# Patient Record
Sex: Male | Born: 1975 | Race: White | Hispanic: No | Marital: Married | Smoking: Former smoker
Health system: Southern US, Community
[De-identification: ages and names within clinical notes are randomized; demographics above are authoritative.]

## PROBLEM LIST (undated history)

## (undated) DIAGNOSIS — I10 Essential (primary) hypertension: Secondary | ICD-10-CM

## (undated) DIAGNOSIS — N200 Calculus of kidney: Secondary | ICD-10-CM

---

## 2014-08-05 ENCOUNTER — Emergency Department: Payer: Self-pay | Admitting: Emergency Medicine

## 2014-08-05 LAB — URINALYSIS, COMPLETE
BILIRUBIN, UR: NEGATIVE
Bacteria: NONE SEEN
GLUCOSE, UR: NEGATIVE mg/dL (ref 0–75)
Hyaline Cast: 4
LEUKOCYTE ESTERASE: NEGATIVE
Nitrite: NEGATIVE
PH: 5 (ref 4.5–8.0)
Protein: 100
SPECIFIC GRAVITY: 1.015 (ref 1.003–1.030)
SQUAMOUS EPITHELIAL: NONE SEEN

## 2014-08-05 LAB — COMPREHENSIVE METABOLIC PANEL
ALT: 66 U/L — AB
Albumin: 4.3 g/dL (ref 3.4–5.0)
Alkaline Phosphatase: 104 U/L
Anion Gap: 20 — ABNORMAL HIGH (ref 7–16)
BUN: 14 mg/dL (ref 7–18)
Bilirubin,Total: 1.2 mg/dL — ABNORMAL HIGH (ref 0.2–1.0)
CALCIUM: 9.7 mg/dL (ref 8.5–10.1)
Chloride: 105 mmol/L (ref 98–107)
Co2: 13 mmol/L — ABNORMAL LOW (ref 21–32)
Creatinine: 1.49 mg/dL — ABNORMAL HIGH (ref 0.60–1.30)
EGFR (Non-African Amer.): 58 — ABNORMAL LOW
GLUCOSE: 136 mg/dL — AB (ref 65–99)
Osmolality: 278 (ref 275–301)
Potassium: 3.2 mmol/L — ABNORMAL LOW (ref 3.5–5.1)
SGOT(AST): 40 U/L — ABNORMAL HIGH (ref 15–37)
SODIUM: 138 mmol/L (ref 136–145)
TOTAL PROTEIN: 8 g/dL (ref 6.4–8.2)

## 2014-08-05 LAB — CBC
HCT: 54.4 % — AB (ref 40.0–52.0)
HGB: 18.3 g/dL — ABNORMAL HIGH (ref 13.0–18.0)
MCH: 29.3 pg (ref 26.0–34.0)
MCHC: 33.7 g/dL (ref 32.0–36.0)
MCV: 87 fL (ref 80–100)
Platelet: 242 10*3/uL (ref 150–440)
RBC: 6.23 10*6/uL — ABNORMAL HIGH (ref 4.40–5.90)
RDW: 13.5 % (ref 11.5–14.5)
WBC: 13.3 10*3/uL — AB (ref 3.8–10.6)

## 2014-08-05 LAB — LIPASE, BLOOD: Lipase: 165 U/L (ref 73–393)

## 2015-05-26 ENCOUNTER — Emergency Department
Admission: EM | Admit: 2015-05-26 | Discharge: 2015-05-26 | Disposition: A | Discharge status: Home / Self Care | Payer: BLUE CROSS/BLUE SHIELD | Attending: Emergency Medicine | Admitting: Emergency Medicine

## 2015-05-26 ENCOUNTER — Encounter: Payer: Self-pay | Admitting: General Practice

## 2015-05-26 ENCOUNTER — Other Ambulatory Visit: Payer: Self-pay

## 2015-05-26 DIAGNOSIS — I10 Essential (primary) hypertension: Secondary | ICD-10-CM | POA: Insufficient documentation

## 2015-05-26 DIAGNOSIS — Z87891 Personal history of nicotine dependence: Secondary | ICD-10-CM | POA: Diagnosis not present

## 2015-05-26 DIAGNOSIS — R1011 Right upper quadrant pain: Secondary | ICD-10-CM | POA: Insufficient documentation

## 2015-05-26 HISTORY — DX: Calculus of kidney: N20.0

## 2015-05-26 HISTORY — DX: Essential (primary) hypertension: I10

## 2015-05-26 LAB — COMPREHENSIVE METABOLIC PANEL
ALT: 26 U/L (ref 17–63)
ANION GAP: 10 (ref 5–15)
AST: 27 U/L (ref 15–41)
Albumin: 4.3 g/dL (ref 3.5–5.0)
Alkaline Phosphatase: 74 U/L (ref 38–126)
BUN: 15 mg/dL (ref 6–20)
CO2: 25 mmol/L (ref 22–32)
CREATININE: 0.94 mg/dL (ref 0.61–1.24)
Calcium: 10 mg/dL (ref 8.9–10.3)
Chloride: 105 mmol/L (ref 101–111)
GFR calc Af Amer: >60 mL/min (ref >60)
Glucose, Bld: 97 mg/dL (ref 65–99)
Potassium: 3.6 mmol/L (ref 3.5–5.1)
Sodium: 140 mmol/L (ref 135–145)
Total Bilirubin: 0.8 mg/dL (ref 0.3–1.2)
Total Protein: 7.4 g/dL (ref 6.5–8.1)

## 2015-05-26 LAB — CBC WITH DIFFERENTIAL/PLATELET
BASOS PCT: 1 %
Basophils Absolute: 0.1 10*3/uL (ref 0–0.1)
Eosinophils Absolute: 0.1 10*3/uL (ref 0–0.7)
Eosinophils Relative: 2 %
HCT: 49.8 % (ref 40.0–52.0)
Hemoglobin: 17.3 g/dL (ref 13.0–18.0)
Lymphocytes Relative: 12 %
Lymphs Abs: 1.1 10*3/uL (ref 1.0–3.6)
MCH: 29.9 pg (ref 26.0–34.0)
MCHC: 34.7 g/dL (ref 32.0–36.0)
MCV: 86.2 fL (ref 80.0–100.0)
MONO ABS: 0.7 10*3/uL (ref 0.2–1.0)
MONOS PCT: 8 %
NEUTROS ABS: 6.7 10*3/uL — AB (ref 1.4–6.5)
NEUTROS PCT: 77 %
PLATELETS: 203 10*3/uL (ref 150–440)
RBC: 5.77 MIL/uL (ref 4.40–5.90)
RDW: 14 % (ref 11.5–14.5)
WBC: 8.7 10*3/uL (ref 3.8–10.6)

## 2015-05-26 LAB — URINALYSIS COMPLETE WITH MICROSCOPIC (ARMC ONLY)
Bacteria, UA: NONE SEEN
Bilirubin Urine: NEGATIVE
Glucose, UA: 50 mg/dL — AB
Hgb urine dipstick: NEGATIVE
Ketones, ur: NEGATIVE mg/dL
LEUKOCYTES UA: NEGATIVE
NITRITE: NEGATIVE
PROTEIN: 30 mg/dL — AB
SPECIFIC GRAVITY, URINE: 1.016 (ref 1.005–1.030)
WBC, UA: NONE SEEN WBC/hpf (ref 0–5)
pH: 7 (ref 5.0–8.0)

## 2015-05-26 LAB — TROPONIN I: Troponin I: <0.03 ng/mL (ref <0.031)

## 2015-05-26 MED ORDER — TAMSULOSIN HCL 0.4 MG PO CAPS: 0.4000 mg | ORAL_CAPSULE | Freq: Every day | ORAL | Status: AC

## 2015-05-26 NOTE — Discharge Instructions (Signed)
Please seek medical attention for any high fevers, chest pain, shortness of breath, change in behavior, persistent vomiting, bloody stool or any other new or concerning symptoms. ° °Flank Pain °Flank pain refers to pain that is located on the side of the body between the upper abdomen and the back. The pain may occur over a short period of time (acute) or may be long-term or reoccurring (chronic). It may be mild or severe. Flank pain can be caused by many things. °CAUSES  °Some of the more common causes of flank pain include: °· Muscle strains.   °· Muscle spasms.   °· A disease of your spine (vertebral disk disease).   °· A lung infection (pneumonia).   °· Fluid around your lungs (pulmonary edema).   °· A kidney infection.   °· Kidney stones.   °· A very painful skin rash caused by the chickenpox virus (shingles).   °· Gallbladder disease.   °HOME CARE INSTRUCTIONS  °Home care will depend on the cause of your pain. In general, °· Rest as directed by your caregiver. °· Drink enough fluids to keep your urine clear or pale yellow. °· Only take over-the-counter or prescription medicines as directed by your caregiver. Some medicines may help relieve the pain. °· Tell your caregiver about any changes in your pain. °· Follow up with your caregiver as directed. °SEEK IMMEDIATE MEDICAL CARE IF:  °· Your pain is not controlled with medicine.   °· You have new or worsening symptoms. °· Your pain increases.   °· You have abdominal pain.   °· You have shortness of breath.   °· You have persistent nausea or vomiting.   °· You have swelling in your abdomen.   °· You feel faint or pass out.   °· You have blood in your urine. °· You have a fever or persistent symptoms for more than 2-3 days. °· You have a fever and your symptoms suddenly get worse. °MAKE SURE YOU:  °· Understand these instructions. °· Will watch your condition. °· Will get help right away if you are not doing well or get worse. °Document Released: 01/03/2006  Document Revised: 08/06/2012 Document Reviewed: 06/26/2012 °Exit[employer]® Patient Information ©2015 [employer]. This information is not intended to replace advice given to you by your health care provider. Make sure you discuss any questions you have with your health care provider. ° °

## 2015-05-26 NOTE — ED Notes (Signed)
Pt came with the c/o right sided abd and flank pain. Pt states that he pain started on Monday and that he had vomiting.  Pt states that the pain increased today. Pt has a hx of kidney stones, pt states that he drank a lot of water today and that he feels like when he went to the bathroom here that he passed the stone. Pt is pain free now and just wants to verify that it was not his heart.

## 2015-05-26 NOTE — ED Notes (Signed)
Pt. Arrived to ed from home with reports of experiencing sudden onset of RUQ pain that started this AM. Denies N/V. Pt states "its probably a kidney stone, i had one last year;" Pt. Denies urinary symptoms at this time. Alert and oriented.

## 2015-05-26 NOTE — ED Provider Notes (Signed)
[facility] Emergency Department Provider Note   ____________________________________________  Time seen: 1625  I have reviewed the triage vital signs and the nursing notes.   HISTORY  Chief Complaint Abdominal Pain   History limited by: Not Limited   HPI Alejandro Barnes is a 39 y.o. male who presents to the emergency department with concerns for right upper quadrant/right flank pain. States that the pain started suddenly. It was sharp in nature. It does not radiate. The pain has since resolved. Patient states he had similar pain with a previous kidney stone. Patient denies any vomiting with today's pain however did have some vomiting and diarrhea couple days ago which he attributed to a stomach bug. He has not had any chest pain or shortness breath. He denies any fevers.     Past Medical History  Diagnosis Date  . Hypertension   . Kidney stone     There are no active problems to display for this patient.   History reviewed. No pertinent past surgical history.  No current outpatient prescriptions on file.  Allergies Review of patient's allergies indicates no known allergies.  No family history on file.  Social History History  Substance Use Topics  . Smoking status: Former Games developer  . Smokeless tobacco: Not on file  . Alcohol Use: No    Review of Systems  Constitutional: Negative for fever. Cardiovascular: Negative for chest pain. Respiratory: Negative for shortness of breath. Gastrointestinal: Right upper abdominal/flank pain Genitourinary: Negative for dysuria. Musculoskeletal: Negative for back pain. Skin: Negative for rash. Neurological: Negative for headaches, focal weakness or numbness.   10-point ROS otherwise negative.  ____________________________________________   PHYSICAL EXAM:  VITAL SIGNS: ED Triage Vitals  Enc Vitals Group     BP 05/26/15 1525 239/114 mmHg     Pulse Rate 05/26/15 1523 61     Resp  05/26/15 1523 18     Temp 05/26/15 1523 98.1 F (36.7 C)     Temp Source 05/26/15 1523 Oral     SpO2 05/26/15 1523 100 %     Weight 05/26/15 1523 270 lb (122.471 kg)     Height 05/26/15 1523  (1.854 m)     Head Cir --      Peak Flow --      Pain Score 05/26/15 1524 6   Constitutional: Alert and oriented. Well appearing and in no distress. Eyes: Conjunctivae are normal. PERRL. Normal extraocular movements. ENT   Head: Normocephalic and atraumatic.   Nose: No congestion/rhinnorhea.   Mouth/Throat: Mucous membranes are moist.   Neck: No stridor. Hematological/Lymphatic/Immunilogical: No cervical lymphadenopathy. Cardiovascular: Normal rate, regular rhythm.  No murmurs, rubs, or gallops. Respiratory: Normal respiratory effort without tachypnea nor retractions. Breath sounds are clear and equal bilaterally. No wheezes/rales/rhonchi. Gastrointestinal: Soft and nontender. No distention. There is no CVA tenderness. Genitourinary: Deferred Musculoskeletal: Normal range of motion in all extremities. No joint effusions.  No lower extremity tenderness nor edema. Neurologic:  Normal speech and language. No gross focal neurologic deficits are appreciated. Speech is normal.  Skin:  Skin is warm, dry and intact. No rash noted. Psychiatric: Mood and affect are normal. Speech and behavior are normal. Patient exhibits appropriate insight and judgment.  ____________________________________________    LABS (pertinent positives/negatives)  Labs Reviewed  CBC WITH DIFFERENTIAL/PLATELET - Abnormal; Notable for the following:    Neutro Abs 6.7 (*)    All other components within normal limits  URINALYSIS COMPLETEWITH MICROSCOPIC ([facility] ONLY) - Abnormal; Notable for the following:  Color, Urine YELLOW (*)    APPearance CLEAR (*)    Glucose, UA 50 (*)    Protein, ur 30 (*)    Squamous Epithelial / LPF 0-5 (*)    All other components within normal limits  COMPREHENSIVE METABOLIC  PANEL  TROPONIN I     ____________________________________________   EKG  I, Phineas SemenGraydon Keywon Mestre, attending physician, personally viewed and interpreted this EKG  EKG Time: 1532 Rate: 64 Rhythm: Normal sinus rhythm Axis: Normal Intervals: qtc 455 QRS: Narrow ST changes: No ST elevation    ____________________________________________    RADIOLOGY  None  ____________________________________________   PROCEDURES  Procedure(s) performed: None  Critical Care performed: No  ____________________________________________   INITIAL IMPRESSION / ASSESSMENT AND PLAN / ED COURSE  Pertinent labs & imaging results that were available during my care of the patient were reviewed by me and considered in my medical decision making (see chart for details).  Patient here with what he believes was kidney stone pain. Patient had right flank/upper abdominal pain. This is not completely resolved. Blood work without any concerning findings. Think is reasonable to think that patient might of had a kidney stone. Given history of kidney stones some worsening pain in the past. Will discharge patient home. Will give prescription for Flomax as pain returns however instruct patient not to necessarily fill unless pain gets worse.  ____________________________________________   FINAL CLINICAL IMPRESSION(S) / ED DIAGNOSES  Final diagnoses:  Right upper quadrant pain     Phineas SemenGraydon Ron Junco, MD 05/26/15 [phone]

## 2015-05-30 ENCOUNTER — Other Ambulatory Visit: Payer: Self-pay

## 2015-05-30 ENCOUNTER — Emergency Department
Admission: EM | Admit: 2015-05-30 | Discharge: 2015-05-30 | Disposition: A | Discharge status: Home / Self Care | Payer: BLUE CROSS/BLUE SHIELD | Attending: Emergency Medicine | Admitting: Emergency Medicine

## 2015-05-30 ENCOUNTER — Encounter: Payer: Self-pay | Admitting: *Deleted

## 2015-05-30 ENCOUNTER — Emergency Department: Payer: BLUE CROSS/BLUE SHIELD

## 2015-05-30 DIAGNOSIS — R079 Chest pain, unspecified: Secondary | ICD-10-CM | POA: Diagnosis present

## 2015-05-30 DIAGNOSIS — Z79899 Other long term (current) drug therapy: Secondary | ICD-10-CM | POA: Insufficient documentation

## 2015-05-30 DIAGNOSIS — I10 Essential (primary) hypertension: Secondary | ICD-10-CM | POA: Insufficient documentation

## 2015-05-30 DIAGNOSIS — Z87891 Personal history of nicotine dependence: Secondary | ICD-10-CM | POA: Insufficient documentation

## 2015-05-30 LAB — BASIC METABOLIC PANEL
Anion gap: 8 (ref 5–15)
BUN: 12 mg/dL (ref 6–20)
CO2: 26 mmol/L (ref 22–32)
CREATININE: 0.95 mg/dL (ref 0.61–1.24)
Calcium: 9.5 mg/dL (ref 8.9–10.3)
Chloride: 105 mmol/L (ref 101–111)
GFR calc non Af Amer: >60 mL/min (ref >60)
Glucose, Bld: 92 mg/dL (ref 65–99)
Potassium: 3.5 mmol/L (ref 3.5–5.1)
SODIUM: 139 mmol/L (ref 135–145)

## 2015-05-30 LAB — CBC
HEMATOCRIT: 48.3 % (ref 40.0–52.0)
Hemoglobin: 16.7 g/dL (ref 13.0–18.0)
MCH: 29.9 pg (ref 26.0–34.0)
MCHC: 34.5 g/dL (ref 32.0–36.0)
MCV: 86.6 fL (ref 80.0–100.0)
PLATELETS: 224 10*3/uL (ref 150–440)
RBC: 5.58 MIL/uL (ref 4.40–5.90)
RDW: 13.3 % (ref 11.5–14.5)
WBC: 9 10*3/uL (ref 3.8–10.6)

## 2015-05-30 LAB — LIPASE, BLOOD: LIPASE: 37 U/L (ref 22–51)

## 2015-05-30 LAB — TROPONIN I
Troponin I: 0.03 ng/mL (ref <0.031)
Troponin I: 0.03 ng/mL (ref <0.031)

## 2015-05-30 MED ORDER — HYDROCHLOROTHIAZIDE 25 MG PO TABS: 25.0000 mg | ORAL_TABLET | Freq: Every day | ORAL | Status: AC

## 2015-05-30 MED ORDER — LABETALOL HCL 5 MG/ML IV SOLN
INTRAVENOUS | Status: AC
  Administered 2015-05-30: 10 mg via INTRAVENOUS
  Filled 2015-05-30: qty 4

## 2015-05-30 MED ORDER — LABETALOL HCL 5 MG/ML IV SOLN
10.0000 mg | Freq: Once | INTRAVENOUS | Status: AC
  Administered 2015-05-30: 10 mg via INTRAVENOUS

## 2015-05-30 NOTE — Discharge Instructions (Signed)

## 2015-05-30 NOTE — ED Notes (Signed)
Pain mid chest past few hours, diarrhea today, sharp pain, was seen here few days ago for ruq abd pain

## 2015-05-30 NOTE — ED Provider Notes (Signed)
-----------------------------------------   5:24 PM on 05/30/2015 -----------------------------------------  Second troponin is negative. Blood pressure is improved. Due to significance of hypertension, I'll start the patient on HCTZ pending follow-up with primary care. Chest pain is improved as well. Patient medically stable for discharge  Sharman CheekPhillip Erma Raiche, MD 05/30/15 1724

## 2015-05-30 NOTE — ED Provider Notes (Signed)
[facility] Emergency Department Provider Note  ____________________________________________  Time seen: 2 PM  I have reviewed the triage vital signs and the nursing notes.   HISTORY  Chief Complaint Chest Pain    HPI Alejandro Barnes is a 39 y.o. male presents with chest pain. He reports the pain was in the center of his chest was sharp in nature and severe. It is resolved and he has no discomfort at this time. He feels well. He has history of high blood pressure but no history of coronary artery disease. He denies fevers chills. No shortness of breath. The lower show any swelling. No recent travel. The pain did not radiate.     Past Medical History  Diagnosis Date  . Hypertension   . Kidney stone     There are no active problems to display for this patient.   History reviewed. No pertinent past surgical history.  Current Outpatient Rx  Name  Route  Sig  Dispense  Refill  . tamsulosin (FLOMAX) 0.4 MG CAPS capsule   Oral   Take 1 capsule (0.4 mg total) by mouth daily.   10 capsule   0     Allergies Review of patient's allergies indicates no known allergies.  No family history on file.  Social History History  Substance Use Topics  . Smoking status: Former Games developer  . Smokeless tobacco: Not on file  . Alcohol Use: No    Review of Systems  Constitutional: Negative for fever. Eyes: Negative for visual changes. ENT: Negative for sore throat Cardiovascular: Positive for chest Respiratory: Negative for shortness of breath. Gastrointestinal: Negative for abdominal pain, vomiting and diarrhea. Genitourinary: Negative for dysuria. Musculoskeletal: Negative for back pain. Skin: Negative for rash. Neurological: Negative for headaches or focal weakness Psychiatric: No anxiety  10-point ROS otherwise negative.  ____________________________________________   PHYSICAL EXAM:  VITAL SIGNS: ED Triage Vitals  Enc Vitals Group     BP  05/30/15 1225 125/84 mmHg     Pulse Rate 05/30/15 1225 72     Resp 05/30/15 1225 20     Temp 05/30/15 1225 98.4 F (36.9 C)     Temp src --      SpO2 05/30/15 1225 99 %     Weight 05/30/15 1225 270 lb (122.471 kg)     Height 05/30/15 1225  (1.854 m)     Head Cir --      Peak Flow --      Pain Score 05/30/15 1226 6     Pain Loc --      Pain Edu? --      Excl. in GC? --      Constitutional: Alert and oriented. Well appearing and in no distress. Eyes: Conjunctivae are normal.  ENT   Head: Normocephalic and atraumatic.   Mouth/Throat: Mucous membranes are moist. Cardiovascular: Normal rate, regular rhythm. Normal and symmetric distal pulses are present in all extremities. No murmurs, rubs, or gallops. Respiratory: Normal respiratory effort without tachypnea nor retractions. Breath sounds are clear and equal bilaterally.  Gastrointestinal: Soft and non-tender in all quadrants. No distention. There is no CVA tenderness. Genitourinary: deferred Musculoskeletal: Nontender with normal range of motion in all extremities. No lower extremity tenderness nor edema. Neurologic:  Normal speech and language. No gross focal neurologic deficits are appreciated. Skin:  Skin is warm, dry and intact. No rash noted. Psychiatric: Mood and affect are normal. Patient exhibits appropriate insight and judgment.  ____________________________________________    LABS (pertinent positives/negatives)  Labs Reviewed  CBC  TROPONIN I  BASIC METABOLIC PANEL  LIPASE, BLOOD    ____________________________________________   EKG  ED ECG REPORT I, Jene EveryKINNER, Shavon Zenz, the attending physician, personally viewed and interpreted this ECG.  Date: 05/30/2015 EKG Time: 12:35 PM Rate: 61 Rhythm: normal sinus rhythm QRS Axis: normal Intervals: normal ST/T Wave abnormalities: normal Conduction Disutrbances: none Narrative Interpretation:  unremarkable   ____________________________________________    RADIOLOGY I have personally reviewed any xrays that were ordered on this patient:  Chest x-ray no acute distress  ____________________________________________   PROCEDURES  Procedure(s) performed: none  Critical Care performed: none  ____________________________________________   INITIAL IMPRESSION / ASSESSMENT AND PLAN / ED COURSE  Pertinent labs & imaging results that were available during my care of the patient were reviewed by me and considered in my medical decision making (see chart for details).  HPI does not seem consistent with ACS. EKG is reassuring. Initial troponin normal. No pleurisy, no radiation of pain. However patient is hypertensive, we will give labetalol 10 mg IV and check a second troponin.  ____________________________________________ ----------------------------------------- 3:17 PM on 05/30/2015 -----------------------------------------  Care transferred to Dr. Scotty CourtStafford  FINAL CLINICAL IMPRESSION(S) / ED DIAGNOSES  Final diagnoses:  Chest pain, unspecified chest pain type     Jene Everyobert Emerson Schreifels, MD 05/30/15 1517

## 2015-11-03 IMAGING — CT CT ABD-PELV W/O CM
2 of 4 series · 17 of 46 positions shown, 19 images · non-contrast
Comparison: None.

CLINICAL DATA: Severe left flank pain.

EXAM:
CT ABDOMEN AND PELVIS WITHOUT CONTRAST
TECHNIQUE: Multidetector CT imaging of the abdomen and pelvis was performed
following the standard protocol without IV contrast.

[Series 2: stone standard full · axial · 0.88mm/px · z∈[-1056,-551]mm · 14 of 113 slices shown, 16 images]
[im 6/113  soft-tissue]
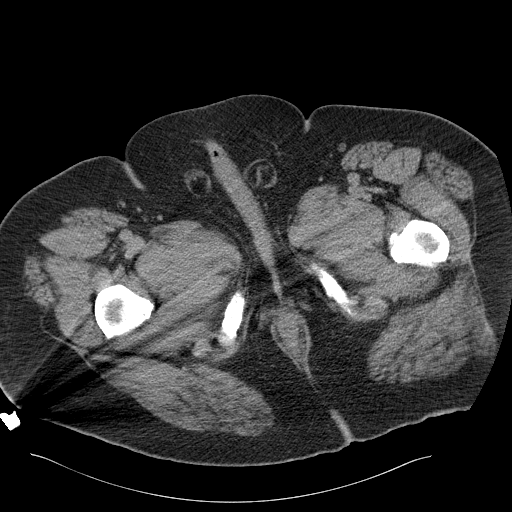
[im 6/113  bone]
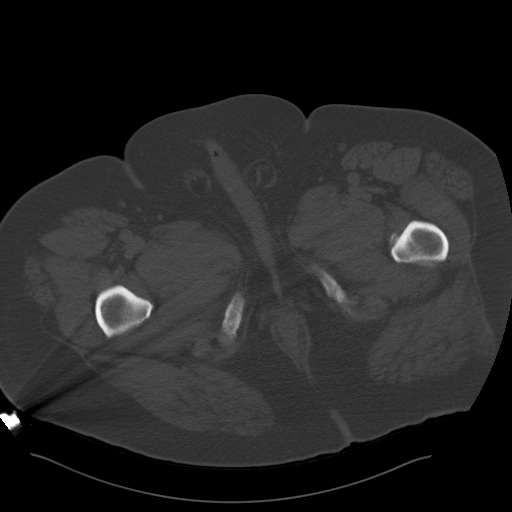
[im 16/113  soft-tissue]
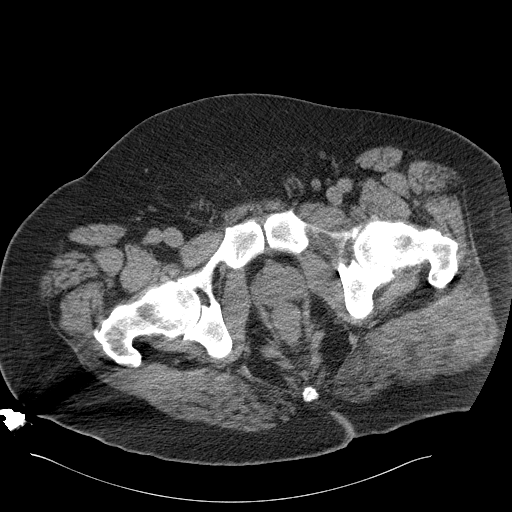
[im 21/113  soft-tissue]
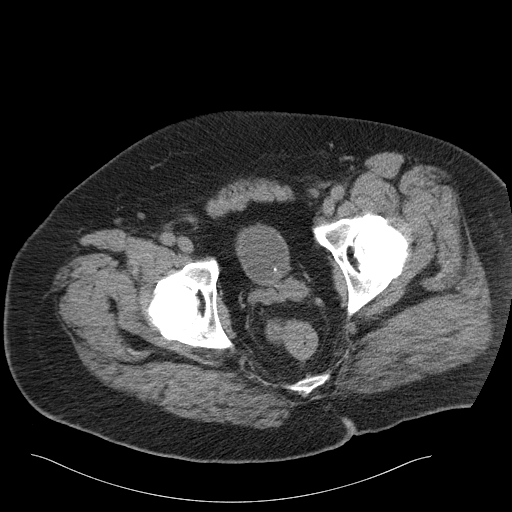
[im 31/113  soft-tissue]
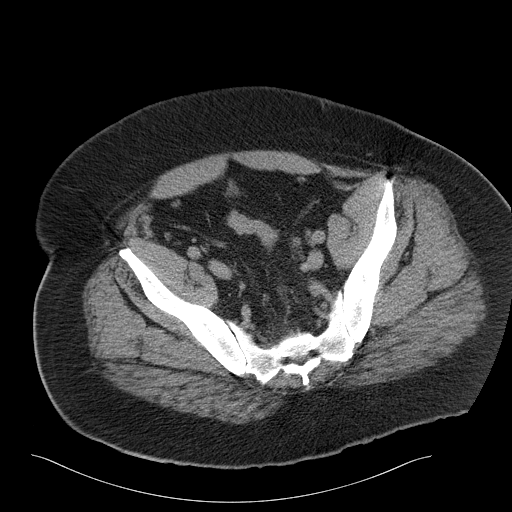
[im 36/113  soft-tissue]
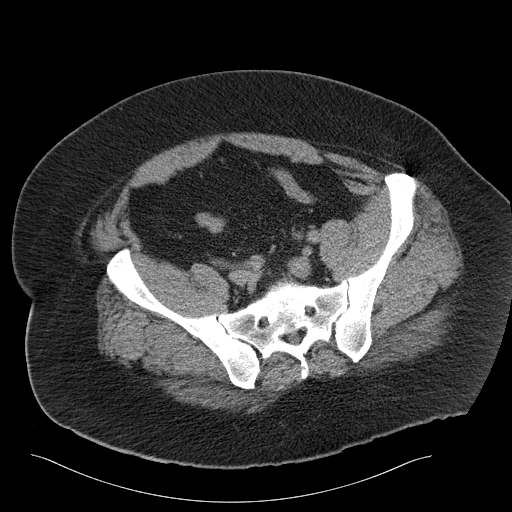
[im 46/113  soft-tissue]
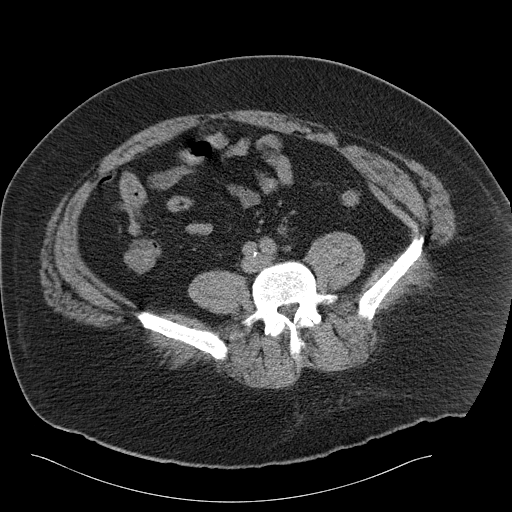
[im 51/113  soft-tissue]
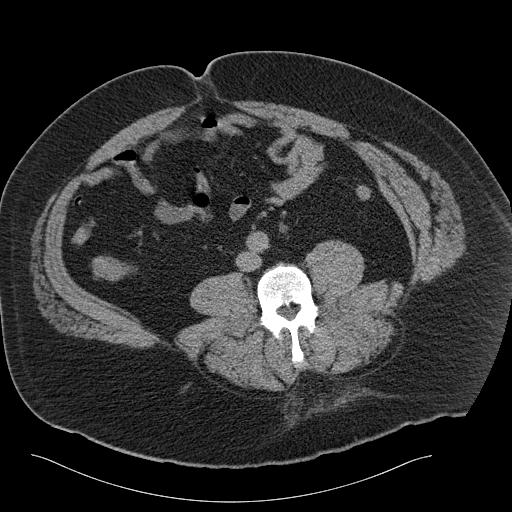
[im 62/113  soft-tissue]
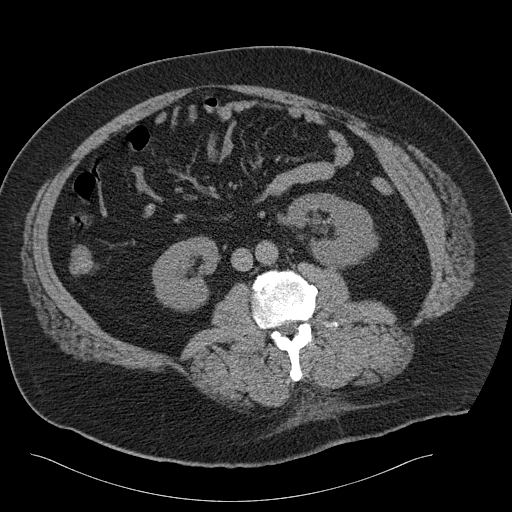
[im 67/113  soft-tissue]
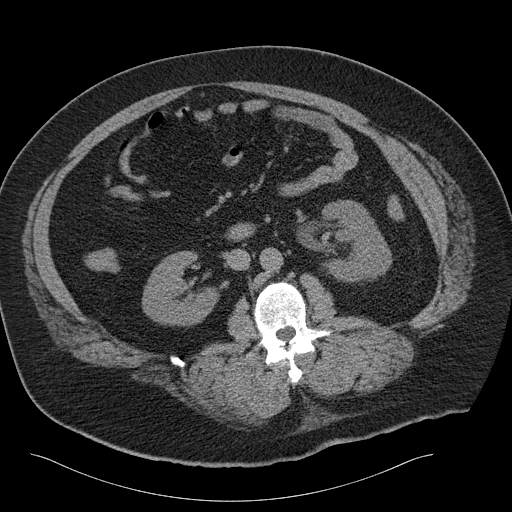
[im 67/113  bone]
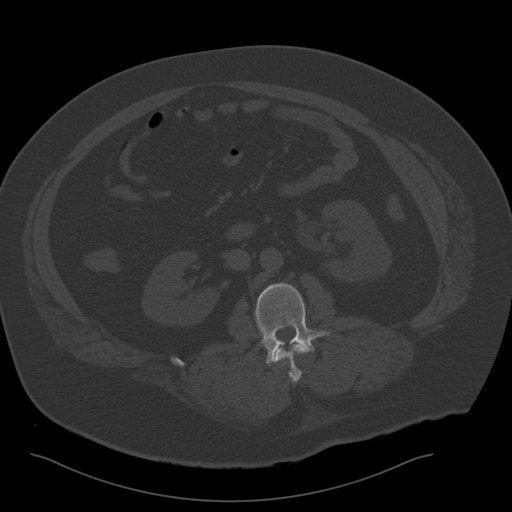
[im 77/113  soft-tissue]
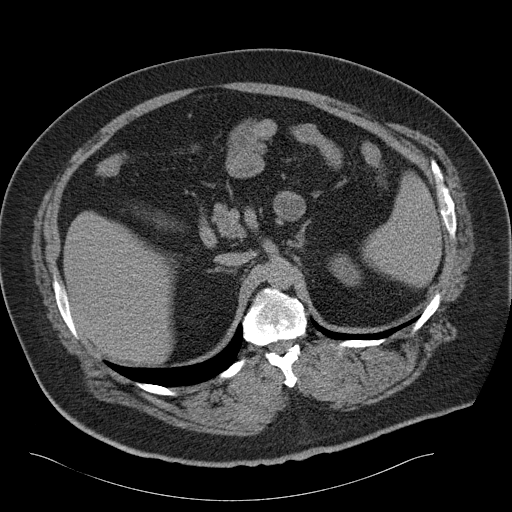
[im 82/113  soft-tissue]
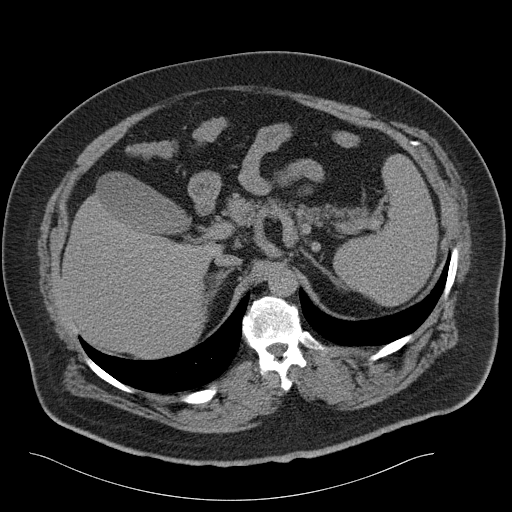
[im 92/113  soft-tissue]
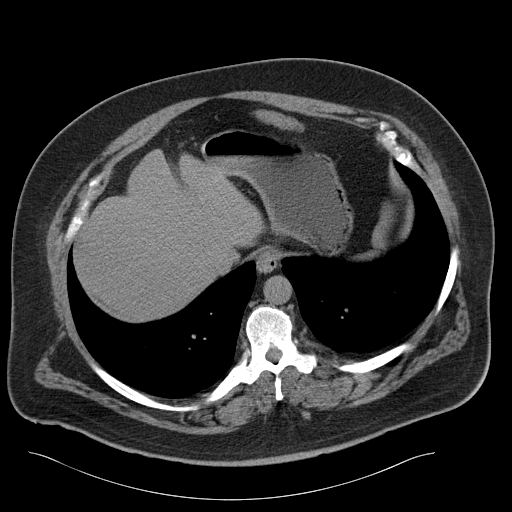
[im 97/113  soft-tissue]
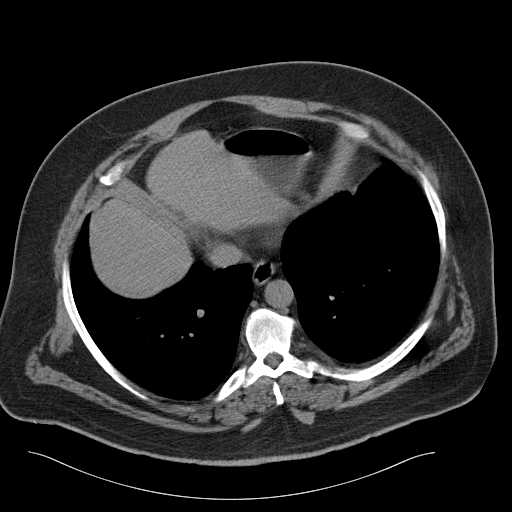
[im 107/113  soft-tissue]
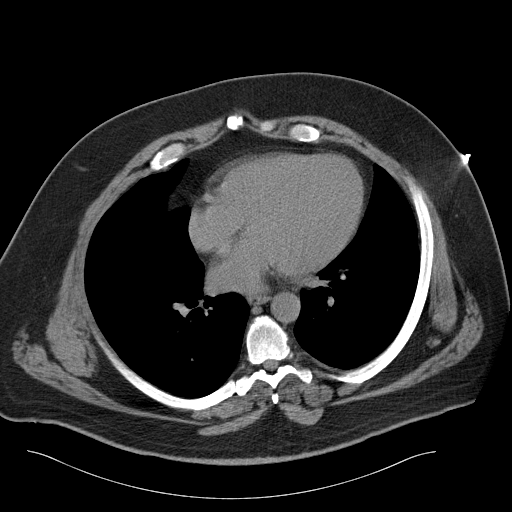

[Series 5: cor stone standard full · coronal · 0.93mm/px · 3 of 178 slices shown]
[im 60/178  soft-tissue]
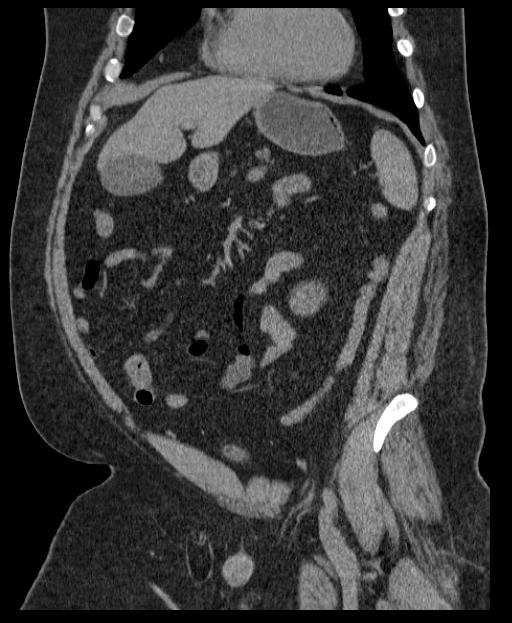
[im 79/178  soft-tissue]
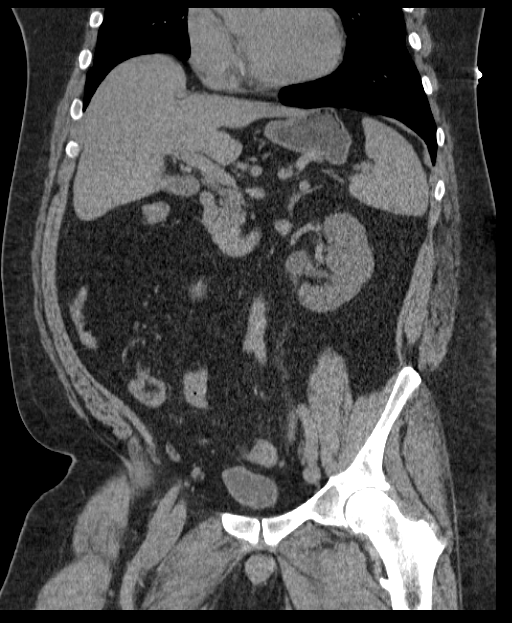
[im 99/178  soft-tissue]
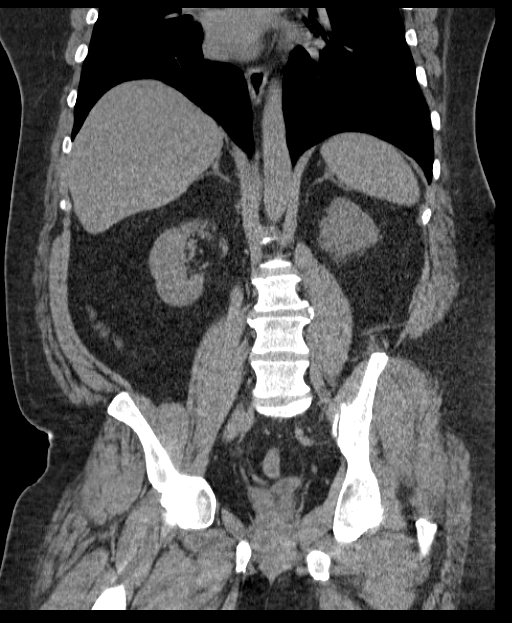

[17 of 46 positions shown; findings below may reference images not displayed]

FINDINGS: There is a 3 mm stone protruding into the bladder at the left
ureterovesical junction. There is slight dilatation of the left
ureter. There is a 2 mm stone in the upper pole of the right kidney
and a 2 mm stone in the lower pole of the left kidney.

Liver, biliary tree, spleen, pancreas, and adrenal glands are
normal. The bowel is normal.

No acute osseous abnormality. Moderate arthritis of the left hip and
slight arthritis of the right hip.
IMPRESSION: 3 mm stone in the distal left ureter protruding into the bladder.
Slight left ureteral dilatation. Tiny bilateral renal calculi.

## 2018-11-17 ENCOUNTER — Emergency Department: Payer: Self-pay

## 2018-11-17 ENCOUNTER — Other Ambulatory Visit: Payer: Self-pay

## 2018-11-17 ENCOUNTER — Encounter: Payer: Self-pay | Admitting: Emergency Medicine

## 2018-11-17 ENCOUNTER — Emergency Department
Admission: EM | Admit: 2018-11-17 | Discharge: 2018-11-17 | Disposition: A | Discharge status: Home / Self Care | Payer: Self-pay | Attending: Emergency Medicine | Admitting: Emergency Medicine

## 2018-11-17 DIAGNOSIS — Z79899 Other long term (current) drug therapy: Secondary | ICD-10-CM | POA: Insufficient documentation

## 2018-11-17 DIAGNOSIS — M4726 Other spondylosis with radiculopathy, lumbar region: Secondary | ICD-10-CM

## 2018-11-17 DIAGNOSIS — Z87891 Personal history of nicotine dependence: Secondary | ICD-10-CM | POA: Insufficient documentation

## 2018-11-17 DIAGNOSIS — M5416 Radiculopathy, lumbar region: Secondary | ICD-10-CM

## 2018-11-17 DIAGNOSIS — I10 Essential (primary) hypertension: Secondary | ICD-10-CM | POA: Insufficient documentation

## 2018-11-17 MED ORDER — ORPHENADRINE CITRATE 30 MG/ML IJ SOLN
60.0000 mg | Freq: Two times a day (BID) | INTRAMUSCULAR | Status: DC
  Administered 2018-11-17: 60 mg via INTRAMUSCULAR
  Filled 2018-11-17: qty 2

## 2018-11-17 MED ORDER — HYDROMORPHONE HCL 1 MG/ML IJ SOLN
1.0000 mg | Freq: Once | INTRAMUSCULAR | Status: AC
  Administered 2018-11-17: 1 mg via INTRAMUSCULAR
  Filled 2018-11-17: qty 1

## 2018-11-17 MED ORDER — METHYLPREDNISOLONE 4 MG PO TBPK: ORAL_TABLET | ORAL | 0 refills | Status: AC

## 2018-11-17 MED ORDER — OXYCODONE-ACETAMINOPHEN 7.5-325 MG PO TABS: 1.0000 | ORAL_TABLET | Freq: Four times a day (QID) | ORAL | 0 refills | Status: AC | PRN

## 2018-11-17 MED ORDER — METHOCARBAMOL 750 MG PO TABS: 750.0000 mg | ORAL_TABLET | Freq: Four times a day (QID) | ORAL | 0 refills | Status: AC

## 2018-11-17 MED ORDER — METHYLPREDNISOLONE SODIUM SUCC 125 MG IJ SOLR
125.0000 mg | Freq: Once | INTRAMUSCULAR | Status: AC
  Administered 2018-11-17: 125 mg via INTRAMUSCULAR
  Filled 2018-11-17: qty 2

## 2018-11-17 NOTE — ED Notes (Signed)
See triage note. Pt denies specific injury to back/leg; denies it feeling like previous kidney stones; on prednisone which has previously been helpful but pt states it is not dec the pain today at all.

## 2018-11-17 NOTE — ED Provider Notes (Signed)
[facility] Emergency Department Provider Note   ____________________________________________   First MD Initiated Contact with Patient 11/17/18 1118     (approximate)  I have reviewed the triage vital signs and the nursing notes.   HISTORY  Chief Complaint Back Pain    HPI Alejandro LoserChristopher Beitz is a 42 y.o. male patient complain of 3 months of increasing low back pain with radicular component to the right lower extremity.  Patient denies bladder bowel dysfunction.  Patient stated mild transient relief with steroids.  Last course of steroids was started on 11/13/2018.  Patient denies provocative incident for complaint.  Patient rates his pain as 8/10.  No current palliative measure for complaint.  Patient described the pain is "achy".  Past Medical History:  Diagnosis Date  . Hypertension   . Kidney stone     There are no active problems to display for this patient.   History reviewed. No pertinent surgical history.  Prior to Admission medications   Medication Sig Start Date End Date Taking? Authorizing Provider  hydrochlorothiazide (HYDRODIURIL) 25 MG tablet Take 1 tablet (25 mg total) by mouth daily. 05/30/15   Sharman CheekStafford, Phillip, MD  methocarbamol (ROBAXIN-750) 750 MG tablet Take 1 tablet (750 mg total) by mouth 4 (four) times daily. 11/17/18   Joni ReiningSmith, Taron Mondor K, PA-C  methylPREDNISolone (MEDROL DOSEPAK) 4 MG TBPK tablet Take Tapered dose as directed 11/17/18   Joni ReiningSmith, Blanche Scovell K, PA-C  oxyCODONE-acetaminophen (PERCOCET) 7.5-325 MG tablet Take 1 tablet by mouth every 6 (six) hours as needed. 11/17/18   Joni ReiningSmith, Nethra Mehlberg K, PA-C  tamsulosin (FLOMAX) 0.4 MG CAPS capsule Take 1 capsule (0.4 mg total) by mouth daily. 05/26/15   Phineas SemenGoodman, Graydon, MD    Allergies Patient has no known allergies.  No family history on file.  Social History Social History   Tobacco Use  . Smoking status: Former Smoker  Substance Use Topics  . Alcohol use: No  . Drug use: No      Review of Systems Constitutional: No fever/chills Eyes: No visual changes. ENT: No sore throat. Cardiovascular: Denies chest pain. Respiratory: Denies shortness of breath. Gastrointestinal: No abdominal pain.  No nausea, no vomiting.  No diarrhea.  No constipation. Genitourinary: Negative for dysuria. Musculoskeletal: Positive for back pain. Skin: Negative for rash. Neurological: Negative for headaches, focal weakness or numbness. Endocrine:Hypertension   ____________________________________________   PHYSICAL EXAM:  VITAL SIGNS: ED Triage Vitals  Enc Vitals Group     BP 11/17/18 1047 (!) 155/110     Pulse Rate 11/17/18 1047 98     Resp 11/17/18 1047 20     Temp 11/17/18 1047 98.2 F (36.8 C)     Temp Source 11/17/18 1047 Oral     SpO2 11/17/18 1047 98 %     Weight 11/17/18 1049 300 lb (136.1 kg)     Height 11/17/18 1049 6' (1.829 m)     Head Circumference --      Peak Flow --      Pain Score 11/17/18 1048 8     Pain Loc --      Pain Edu? --      Excl. in GC? --    Constitutional: Alert and oriented. Well appearing and in no acute distress. Neck: No stridor.  Cardiovascular: Normal rate, regular rhythm. Grossly normal heart sounds.  Good peripheral circulation.  Elevated blood pressure Respiratory: Normal respiratory effort.  No retractions. Lungs CTAB. Musculoskeletal: No obvious spinal deformity.  Patient has diffuse guarding of the lumbar  spine.  Patient negative straight leg test in supine position. Neurologic:  Normal speech and language. No gross focal neurologic deficits are appreciated. No gait instability. Skin:  Skin is warm, dry and intact. No rash noted. Psychiatric: Mood and affect are normal. Speech and behavior are normal.  ____________________________________________   LABS (all labs ordered are listed, but only abnormal results are displayed)  Labs Reviewed - No data to  display ____________________________________________  EKG   ____________________________________________  RADIOLOGY  ED MD interpretation:    Official radiology report(s): Dg Lumbar Spine 2-3 Views  Result Date: 11/17/2018 CLINICAL DATA:  Right-sided low back pain radiating to the right leg with numbness and tingling. EXAM: LUMBAR SPINE - 2-3 VIEW COMPARISON:  CT abdomen 08/05/2014 FINDINGS: Five lumbar type vertebral bodies show normal alignment. Mild disc space narrowing at L3-4 and L5-S1. Mild lower lumbar facet osteoarthritis. No acute radiographic finding. IMPRESSION: Mild lower lumbar degenerative disc disease and degenerative facet disease. No acute finding. Electronically Signed   By: Paulina FusiMark  Shogry M.D.   On: 11/17/2018 12:52    ____________________________________________   PROCEDURES  Procedure(s) performed: None  Procedures  Critical Care performed: No  ____________________________________________   INITIAL IMPRESSION / ASSESSMENT AND PLAN / ED COURSE  As part of my medical decision making, I reviewed the following data within the electronic MEDICAL RECORD NUMBER    Back pain secondary to osteoarthritis with radicular component to the right lower extremity.  Discussed x-ray findings with patient.  Patient given discharge care instruction advised take medication as directed.  Patient advised follow-up PCP for continued care.   ____________________________________________   FINAL CLINICAL IMPRESSION(S) / ED DIAGNOSES  Final diagnoses:  Acute radicular low back pain  Osteoarthritis of spine with radiculopathy, lumbar region     ED Discharge Orders         Ordered    oxyCODONE-acetaminophen (PERCOCET) 7.5-325 MG tablet  Every 6 hours PRN     11/17/18 1319    methylPREDNISolone (MEDROL DOSEPAK) 4 MG TBPK tablet     11/17/18 1319    methocarbamol (ROBAXIN-750) 750 MG tablet  4 times daily     11/17/18 1319           Note:  This document was prepared  using Dragon voice recognition software and may include unintentional dictation errors.    Joni ReiningSmith, Sricharan Lacomb K, PA-C 11/17/18 1324    Schaevitz, Myra Rudeavid Matthew, MD 11/17/18 [phone]

## 2018-11-17 NOTE — ED Triage Notes (Signed)
Patient complaining of right sided lower back pain radiating into right buttock and down right leg with numbness and tingling.  Saw Dr. Floy SabinaKindred and was placed on prednisone, currently on second round.  States he was supposed to have x-ray but could not get off from work..  Denies bowel or bladder problems, denies known injury.  Standing for triage.

## 2018-11-17 NOTE — Discharge Instructions (Addendum)
Take medication as directed and follow-up with PCP.

## 2018-11-17 NOTE — ED Notes (Addendum)
FIRST NURSE NOTE:  Pt reports right side lower back pain that started several weeks ago, pt states he was on prednisone and it got better, pt states he is on prednisone again, but states it is not helping.  Pt states he has had kidney stones before, pt states the pain is not the same.  Pt also states his PCP was going to order X-rays 1 month ago, but states he has not been able to get out of work to go.

## 2020-02-15 IMAGING — CR DG LUMBAR SPINE 2-3V
1 series · 3 of 3 positions shown · non-contrast
Comparison: CT abdomen 08/05/2014

CLINICAL DATA: Right-sided low back pain radiating to the right leg
with numbness and tingling.

EXAM:
LUMBAR SPINE - 2-3 VIEW

[Series 1: dg lumbar spine 2-3 views · 0.14mm/px · 3 of 3 slices shown]
[im 1/3]
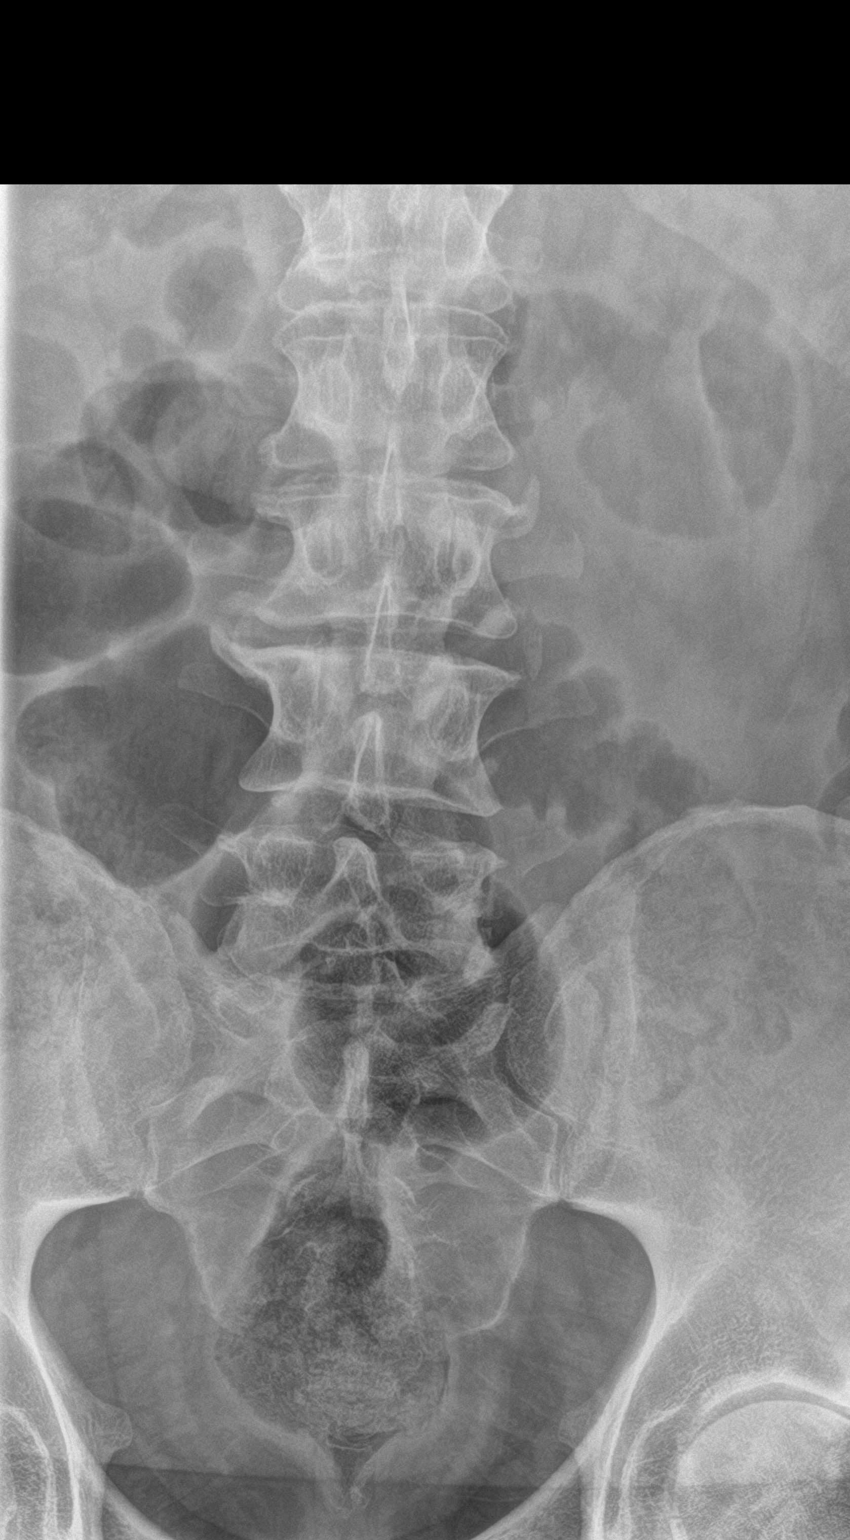
[im 2/3]
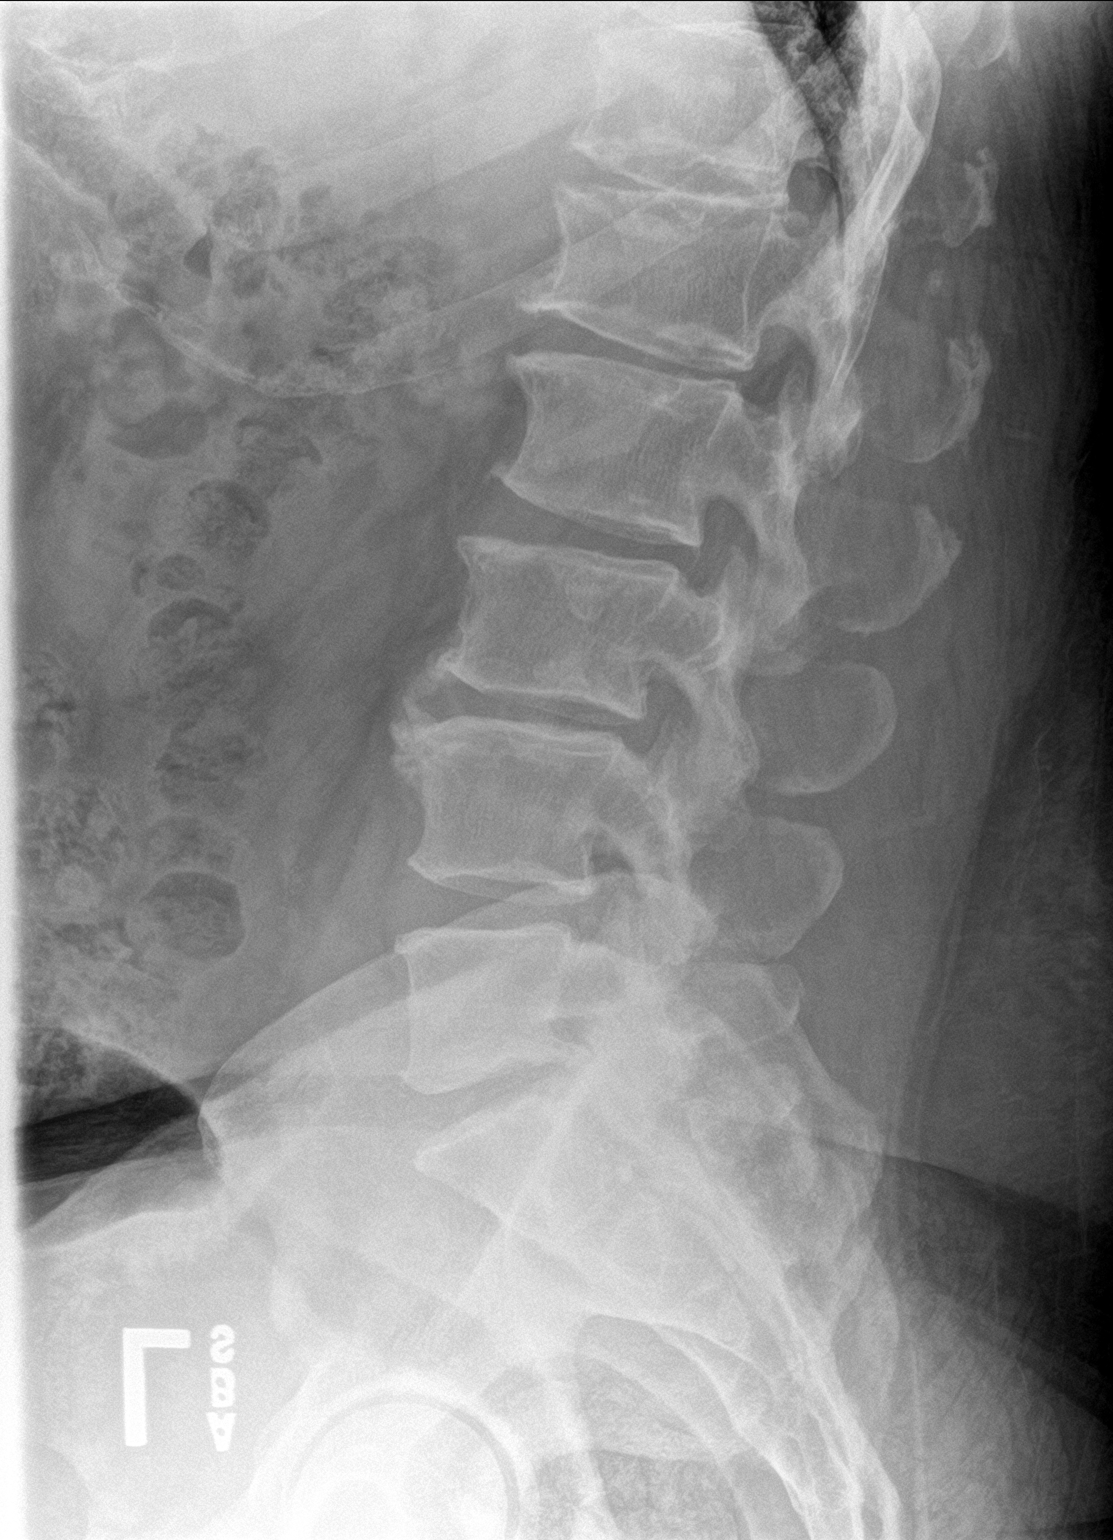
[im 3/3]
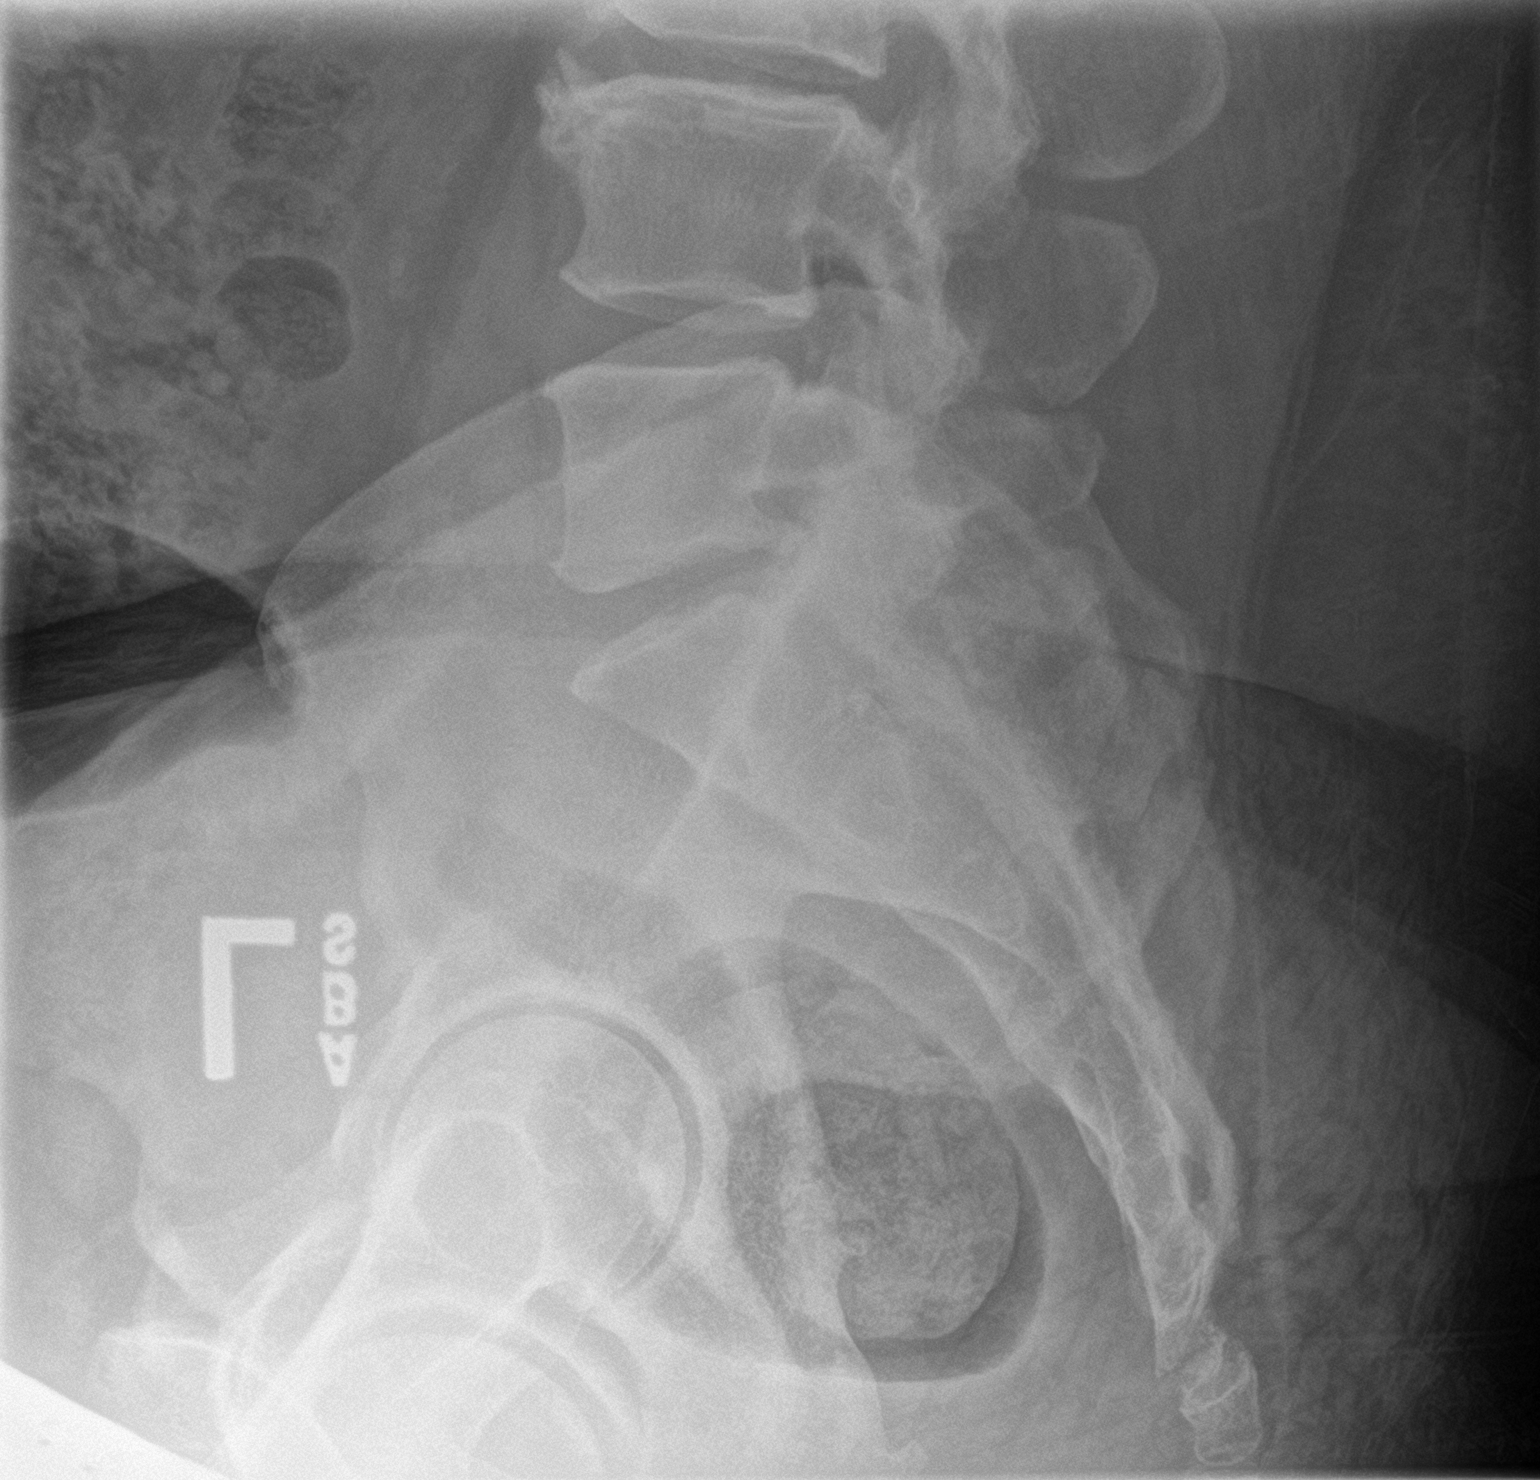

[3 of 3 positions shown; findings below may reference images not displayed]

FINDINGS: Five lumbar type vertebral bodies show normal alignment. Mild disc
space narrowing at L3-4 and L5-S1. Mild lower lumbar facet
osteoarthritis. No acute radiographic finding.
IMPRESSION: Mild lower lumbar degenerative disc disease and degenerative facet
disease. No acute finding.

## 2021-08-26 DEATH — deceased
